# Patient Record
Sex: Male | Born: 1965 | Race: White | Hispanic: No | Smoking: Never smoker
Health system: Southern US, Community
[De-identification: ages and names within clinical notes are randomized; demographics above are authoritative.]

## PROBLEM LIST (undated history)

## (undated) HISTORY — PX: TOOTH EXTRACTION: SUR596

## (undated) HISTORY — PX: WISDOM TOOTH EXTRACTION: SHX21

## (undated) HISTORY — PX: VASCULAR SURGERY: SHX849

---

## 2015-04-28 ENCOUNTER — Other Ambulatory Visit (INDEPENDENT_AMBULATORY_CARE_PROVIDER_SITE_OTHER): Payer: BLUE CROSS/BLUE SHIELD

## 2015-04-28 ENCOUNTER — Ambulatory Visit (INDEPENDENT_AMBULATORY_CARE_PROVIDER_SITE_OTHER): Payer: BLUE CROSS/BLUE SHIELD | Admitting: Family Medicine

## 2015-04-28 ENCOUNTER — Encounter: Payer: Self-pay | Admitting: Family Medicine

## 2015-04-28 ENCOUNTER — Encounter (INDEPENDENT_AMBULATORY_CARE_PROVIDER_SITE_OTHER): Payer: Self-pay

## 2015-04-28 ENCOUNTER — Ambulatory Visit (INDEPENDENT_AMBULATORY_CARE_PROVIDER_SITE_OTHER)
Admission: RE | Admit: 2015-04-28 | Discharge: 2015-04-28 | Disposition: A | Discharge status: Home / Self Care | Payer: BLUE CROSS/BLUE SHIELD | Source: Ambulatory Visit | Attending: Family Medicine | Admitting: Family Medicine

## 2015-04-28 VITALS — BP 116/64 | HR 68 | Temp 97.5°F | Ht 74.0 in | Wt 268.8 lb

## 2015-04-28 DIAGNOSIS — R140 Abdominal distension (gaseous): Secondary | ICD-10-CM

## 2015-04-28 DIAGNOSIS — R222 Localized swelling, mass and lump, trunk: Secondary | ICD-10-CM | POA: Diagnosis not present

## 2015-04-28 DIAGNOSIS — Z0000 Encounter for general adult medical examination without abnormal findings: Secondary | ICD-10-CM | POA: Diagnosis not present

## 2015-04-28 DIAGNOSIS — R7989 Other specified abnormal findings of blood chemistry: Secondary | ICD-10-CM

## 2015-04-28 LAB — CBC WITH DIFFERENTIAL/PLATELET
BASOS ABS: 0 10*3/uL (ref 0.0–0.1)
Basophils Relative: 0.5 % (ref 0.0–3.0)
EOS ABS: 0.2 10*3/uL (ref 0.0–0.7)
Eosinophils Relative: 2.3 % (ref 0.0–5.0)
HEMATOCRIT: 42.7 % (ref 39.0–52.0)
Hemoglobin: 14.4 g/dL (ref 13.0–17.0)
LYMPHS PCT: 28.6 % (ref 12.0–46.0)
Lymphs Abs: 2 10*3/uL (ref 0.7–4.0)
MCHC: 33.8 g/dL (ref 30.0–36.0)
MCV: 95.5 fl (ref 78.0–100.0)
MONOS PCT: 9.5 % (ref 3.0–12.0)
Monocytes Absolute: 0.7 10*3/uL (ref 0.1–1.0)
NEUTROS ABS: 4.1 10*3/uL (ref 1.4–7.7)
NEUTROS PCT: 59.1 % (ref 43.0–77.0)
PLATELETS: 249 10*3/uL (ref 150.0–400.0)
RBC: 4.47 Mil/uL (ref 4.22–5.81)
RDW: 13.4 % (ref 11.5–15.5)
WBC: 6.9 10*3/uL (ref 4.0–10.5)

## 2015-04-28 LAB — COMPREHENSIVE METABOLIC PANEL
ALT: 22 U/L (ref 0–53)
AST: 20 U/L (ref 0–37)
Albumin: 4.2 g/dL (ref 3.5–5.2)
Alkaline Phosphatase: 50 U/L (ref 39–117)
BUN: 9 mg/dL (ref 6–23)
CALCIUM: 9.4 mg/dL (ref 8.4–10.5)
CO2: 26 meq/L (ref 19–32)
Chloride: 102 mEq/L (ref 96–112)
Creatinine, Ser: 1.04 mg/dL (ref 0.40–1.50)
GFR: 81.42 mL/min (ref >60.00)
GLUCOSE: 96 mg/dL (ref 70–99)
Potassium: 3.9 mEq/L (ref 3.5–5.1)
Sodium: 137 mEq/L (ref 135–145)
TOTAL PROTEIN: 7.5 g/dL (ref 6.0–8.3)
Total Bilirubin: 0.7 mg/dL (ref 0.2–1.2)

## 2015-04-28 LAB — LIPID PANEL
CHOL/HDL RATIO: 5
Cholesterol: 205 mg/dL — ABNORMAL HIGH (ref 0–200)
HDL: 40 mg/dL (ref >39.00)
NONHDL: 164.71
TRIGLYCERIDES: 223 mg/dL — AB (ref 0.0–149.0)
VLDL: 44.6 mg/dL — AB (ref 0.0–40.0)

## 2015-04-28 LAB — LDL CHOLESTEROL, DIRECT: Direct LDL: 138 mg/dL

## 2015-04-28 LAB — LIPASE: LIPASE: 33 U/L (ref 11.0–59.0)

## 2015-04-28 LAB — H. PYLORI ANTIBODY, IGG: H PYLORI IGG: NEGATIVE

## 2015-04-28 NOTE — Progress Notes (Signed)
Pre visit review using our clinic review tool, if applicable. No additional management support is needed unless otherwise documented below in the visit note. 

## 2015-04-28 NOTE — Assessment & Plan Note (Signed)
New- ? Prominent sternum but since there is a change and now tender, get CXR for further evaluation. The patient indicates understanding of these issues and agrees with the plan.

## 2015-04-28 NOTE — Assessment & Plan Note (Signed)
New- intermittent. ?biliary colic. He wants to defer RUQ ultrasound at this time. Advised to cut back on ETOH and greasy food- keep symptoms journal. Agrees to labs today. Orders Placed This Encounter  Procedures  . DG Chest 2 View  . CBC with Differential/Platelet  . Comprehensive metabolic panel  . Lipid panel  . PSA  . H. pylori antibody, IgG  . Lipase

## 2015-04-28 NOTE — Progress Notes (Signed)
Subjective:   Patient ID: Christopher Rodriguez, male    DOB: October 12, 1965, 49 y.o.   MRN: [id number]  Christopher Rodriguez is a pleasant 49 y.o. 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  Abdominal bloating/pressure- has had 5 episodes that caused night time awakenings, always occurred after eating greasy food, especially sausage pizza.  Usually lasts an hour or two.  Not alleviated by anything- tried "all types of OTC antacids."  No associated nausea, vomiting, changes in bowel habits, fevers, bloody or black stools. Never had day time symptoms.  ?chest wall mass- has noticed a mass on his sternum for months.  No known injury.  At times painful when he turns a certain way.  No current outpatient prescriptions on file prior to visit.   No current facility-administered medications on file prior to visit.    No Known Allergies  History reviewed. No pertinent past medical history.  Past Surgical History  Procedure Laterality Date  . Vascular surgery      bilateral femoral vein stripping.  . Tooth extraction    . Wisdom tooth extraction      Family History  Problem Relation Age of Onset  . Arthritis Mother   . Hypertension Mother   . Cancer Father   . Asthma Daughter     Social History   Social History  . Marital Status: Unknown    Spouse Name: N/A  . Number of Children: N/A  . Years of Education: N/A   Occupational History  . Not on file.   Social History Main Topics  . Smoking status: Never Smoker   . Smokeless tobacco: Never Used  . Alcohol Use: Yes  . Drug Use: No  . Sexual Activity: Yes   Other Topics Concern  . Not on file   Social History Narrative   Separated.  Kids live in [place], [place] where he is based out of (has home there) but travels all week.  Has been in [place] during the week for a year.       The PMH, PSH, Social History, Family History, Medications, and allergies have been reviewed in [facility], and have been updated if relevant.    Review of Systems  Constitutional: Negative.   HENT: Negative.   Eyes: Negative.   Respiratory: Negative.   Cardiovascular: Negative for chest pain, palpitations and leg swelling.  Gastrointestinal: Positive for abdominal pain and abdominal distention. Negative for nausea, vomiting, diarrhea, constipation, blood in stool, anal bleeding and rectal pain.  Endocrine: Negative.   Genitourinary: Negative.   Musculoskeletal: Negative.   Skin: Negative.   Allergic/Immunologic: Negative.   Neurological: Negative.   Hematological: Negative.   Psychiatric/Behavioral: Negative.   All other systems reviewed and are negative.      Objective:    BP 116/64 mmHg  Pulse 68  Temp(Src) 97.5 F (36.4 C) (Oral)  Ht 6\' 2"  (1.88 m)  Wt 268 lb 12 oz (121.904 kg)  BMI 34.49 kg/m2  SpO2 98%   Physical Exam  Constitutional: He is oriented to person, place, and time. He appears well-developed and well-nourished. No distress.  HENT:  Head: Normocephalic.  Eyes: Conjunctivae are normal.  Neck: Normal range of motion.  Cardiovascular: Normal rate, regular rhythm and normal heart sounds.   Pulmonary/Chest: Effort normal and breath sounds normal. He exhibits no edema, no deformity and  no swelling.    Abdominal: Soft. Bowel sounds are normal. He exhibits no distension and no mass. There is no tenderness. There is no rebound and no guarding.  Musculoskeletal: Normal range of motion. He exhibits no edema.  Neurological: He is alert and oriented to person, place, and time. No cranial nerve deficit.  Skin: Skin is warm and dry.  Psychiatric: He has a normal mood and affect. His behavior is normal. Judgment and thought content normal.  Nursing note and vitals reviewed.         Assessment & Plan:   Abdominal bloating  Chest wall mass No Follow-up on  file.

## 2015-04-28 NOTE — Patient Instructions (Signed)
It was really nice to meet you. We will call you with your chest xray and lab results.

## 2015-12-29 ENCOUNTER — Ambulatory Visit (INDEPENDENT_AMBULATORY_CARE_PROVIDER_SITE_OTHER): Payer: BLUE CROSS/BLUE SHIELD | Admitting: Family Medicine

## 2015-12-29 ENCOUNTER — Ambulatory Visit (HOSPITAL_COMMUNITY): Payer: BLUE CROSS/BLUE SHIELD

## 2015-12-29 ENCOUNTER — Encounter: Payer: Self-pay | Admitting: Family Medicine

## 2015-12-29 ENCOUNTER — Ambulatory Visit (INDEPENDENT_AMBULATORY_CARE_PROVIDER_SITE_OTHER)
Admission: RE | Admit: 2015-12-29 | Discharge: 2015-12-29 | Disposition: A | Discharge status: Home / Self Care | Payer: BLUE CROSS/BLUE SHIELD | Source: Ambulatory Visit | Attending: Family Medicine | Admitting: Family Medicine

## 2015-12-29 VITALS — BP 128/70 | HR 87 | Temp 97.7°F | Wt 273.2 lb

## 2015-12-29 DIAGNOSIS — M109 Gout, unspecified: Secondary | ICD-10-CM

## 2015-12-29 LAB — COMPREHENSIVE METABOLIC PANEL
ALBUMIN: 4.4 g/dL (ref 3.5–5.2)
ALK PHOS: 51 U/L (ref 39–117)
ALT: 33 U/L (ref 0–53)
AST: 20 U/L (ref 0–37)
BILIRUBIN TOTAL: 0.4 mg/dL (ref 0.2–1.2)
BUN: 9 mg/dL (ref 6–23)
CALCIUM: 10.3 mg/dL (ref 8.4–10.5)
CO2: 28 mEq/L (ref 19–32)
CREATININE: 1.04 mg/dL (ref 0.40–1.50)
Chloride: 104 mEq/L (ref 96–112)
GFR: 81.19 mL/min (ref >60.00)
Glucose, Bld: 106 mg/dL — ABNORMAL HIGH (ref 70–99)
Potassium: 4.4 mEq/L (ref 3.5–5.1)
Sodium: 140 mEq/L (ref 135–145)
TOTAL PROTEIN: 8 g/dL (ref 6.0–8.3)

## 2015-12-29 LAB — URIC ACID: Uric Acid, Serum: 8.9 mg/dL — ABNORMAL HIGH (ref 4.0–7.8)

## 2015-12-29 MED ORDER — COLCHICINE 0.6 MG PO TABS: 0.6000 mg | ORAL_TABLET | Freq: Two times a day (BID) | ORAL | Status: DC

## 2015-12-29 MED ORDER — OXYCODONE-ACETAMINOPHEN 5-325 MG PO TABS: 1.0000 | ORAL_TABLET | Freq: Three times a day (TID) | ORAL | Status: DC | PRN

## 2015-12-29 NOTE — Progress Notes (Signed)
   Subjective:   Patient ID: Christopher Rodriguez, male    DOB: February 09, 1966, 50 y.o.   MRN: [id number]  Christopher Rodriguez is a pleasant 50 y.o. year old male who presents to clinic today with Foot Pain and Joint Swelling  on 12/29/2015  HPI:  Left foot pain and swelling- Went to [facility] while traveling last week for acute onset of left foot swelling, redness and pain.  Per pt, no imaging or xray done.  Diagnosed with gout and given 3 tablets of colchicine.  Pain is better but still quite severe.  Swelling has improved.  No current outpatient prescriptions on file prior to visit.   No current facility-administered medications on file prior to visit.    No Known Allergies  No past medical history on file.  Past Surgical History  Procedure Laterality Date  . Vascular surgery      bilateral femoral vein stripping.  . Tooth extraction    . Wisdom tooth extraction      Family History  Problem Relation Age of Onset  . Arthritis Mother   . Hypertension Mother   . Cancer Father   . Asthma Daughter     Social History   Social History  . Marital Status: Unknown    Spouse Name: N/A  . Number of Children: N/A  . Years of Education: N/A   Occupational History  . Not on file.   Social History Main Topics  . Smoking status: Never Smoker   . Smokeless tobacco: Never Used  . Alcohol Use: Yes  . Drug Use: No  . Sexual Activity: Yes   Other Topics Concern  . Not on file   Social History Narrative   Separated.  [place] live in [place], [place] where he is based out of (has home there) but travels all week.  Has been in [place] during the week for a year.      The PMH, PSH, Social History, Family History, Medications, and allergies [facility], and have been updated if relevant.   Review of Systems  Musculoskeletal: Positive for joint swelling and arthralgias.  All other systems reviewed and are negative.      Objective:    BP 128/70 mmHg  Pulse 87  Temp(Src) 97.7 F (36.5  C) (Oral)  Wt 273 lb 4 oz (123.945 kg)  SpO2 97%   Physical Exam  Constitutional: He is oriented to person, place, and time. He appears well-developed and well-nourished. No distress.  HENT:  Head: Normocephalic.  Eyes: Conjunctivae are normal.  Cardiovascular: Normal rate.   Pulmonary/Chest: Effort normal.  Musculoskeletal:       Left ankle: He exhibits swelling. Tenderness.  Neurological: He is alert and oriented to person, place, and time. No cranial nerve deficit.  Skin: Skin is warm and dry. He is not diaphoretic.  Psychiatric: He has a normal mood and affect. His behavior is normal. Judgment and thought content normal.  Nursing note and vitals reviewed.         Assessment & Plan:   Gout, unspecified cause, unspecified chronicity, unspecified site - Plan: Uric Acid, DG Foot Complete Left, Comprehensive metabolic panel No Follow-up on file.

## 2015-12-29 NOTE — Progress Notes (Signed)
Pre visit review using our clinic review tool, if applicable. No additional management support is needed unless otherwise documented below in the visit note. 

## 2015-12-29 NOTE — Assessment & Plan Note (Signed)
New diagnosis which is likely but I do want to check uric acid and get xray today.  He does have significant swelling of his entire foot without injury. Colchicine rx refilled. Given rx for percocet for severe pain. Call or return to clinic prn if these symptoms worsen or fail to improve as anticipated. The patient indicates understanding of these issues and agrees with the plan.

## 2015-12-29 NOTE — Patient Instructions (Signed)
Good to see you. I will call you with your xray and lab results from today.  Take colchicine as directed.  Percocet as needed for severe pain.

## 2015-12-30 ENCOUNTER — Telehealth: Payer: Self-pay

## 2015-12-30 NOTE — Telephone Encounter (Signed)
Fax received, completed at cover my meds. For pt's colchicine Awaiting response.

## 2016-01-02 MED ORDER — COLCHICINE 0.6 MG PO TABS: 0.6000 mg | ORAL_TABLET | Freq: Two times a day (BID) | ORAL | Status: DC

## 2016-01-02 NOTE — Addendum Note (Signed)
Addended by: Desmond DikeKNIGHT, Ciji Boston H on: 01/02/2016 09:47 AM   Modules accepted: Orders

## 2016-01-02 NOTE — Telephone Encounter (Signed)
Fax received indicating brand name covered. New Rx sent to pharmacy

## 2016-03-26 ENCOUNTER — Telehealth: Payer: Self-pay

## 2016-03-26 NOTE — Telephone Encounter (Signed)
Eliquis approved per [employer] through 09/02/2016. PA- [id number].

## 2016-09-15 IMAGING — CR DG CHEST 2V
4 series · 4 of 4 positions shown · non-contrast
Comparison: None.

CLINICAL DATA: Chest wall/sternal mass.

EXAM:
CHEST  2 VIEW

[view not recorded (1 of 4)]
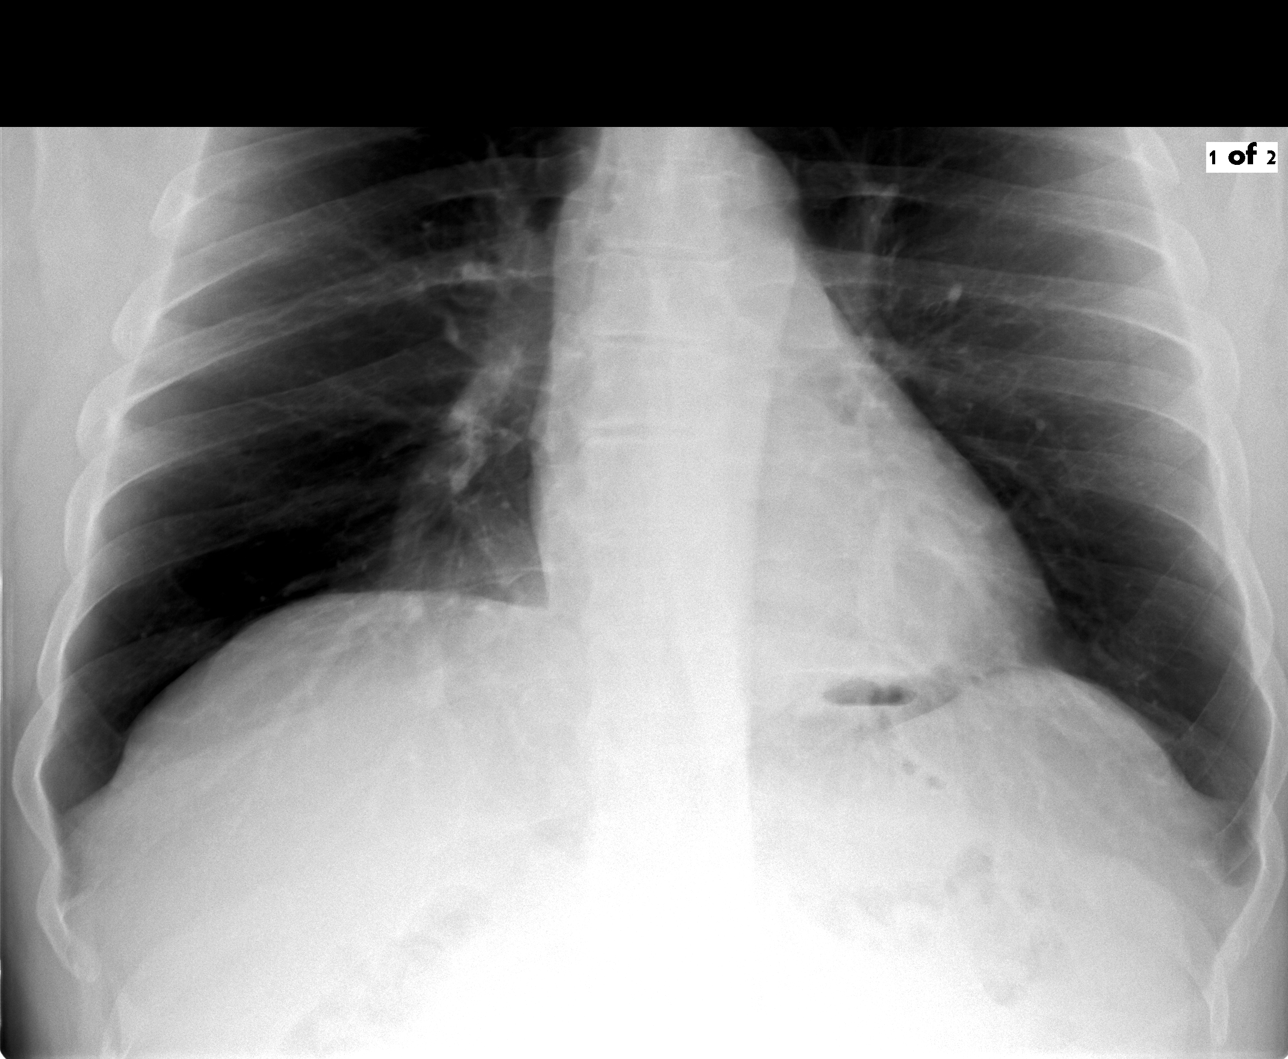

[view not recorded (2 of 4)]
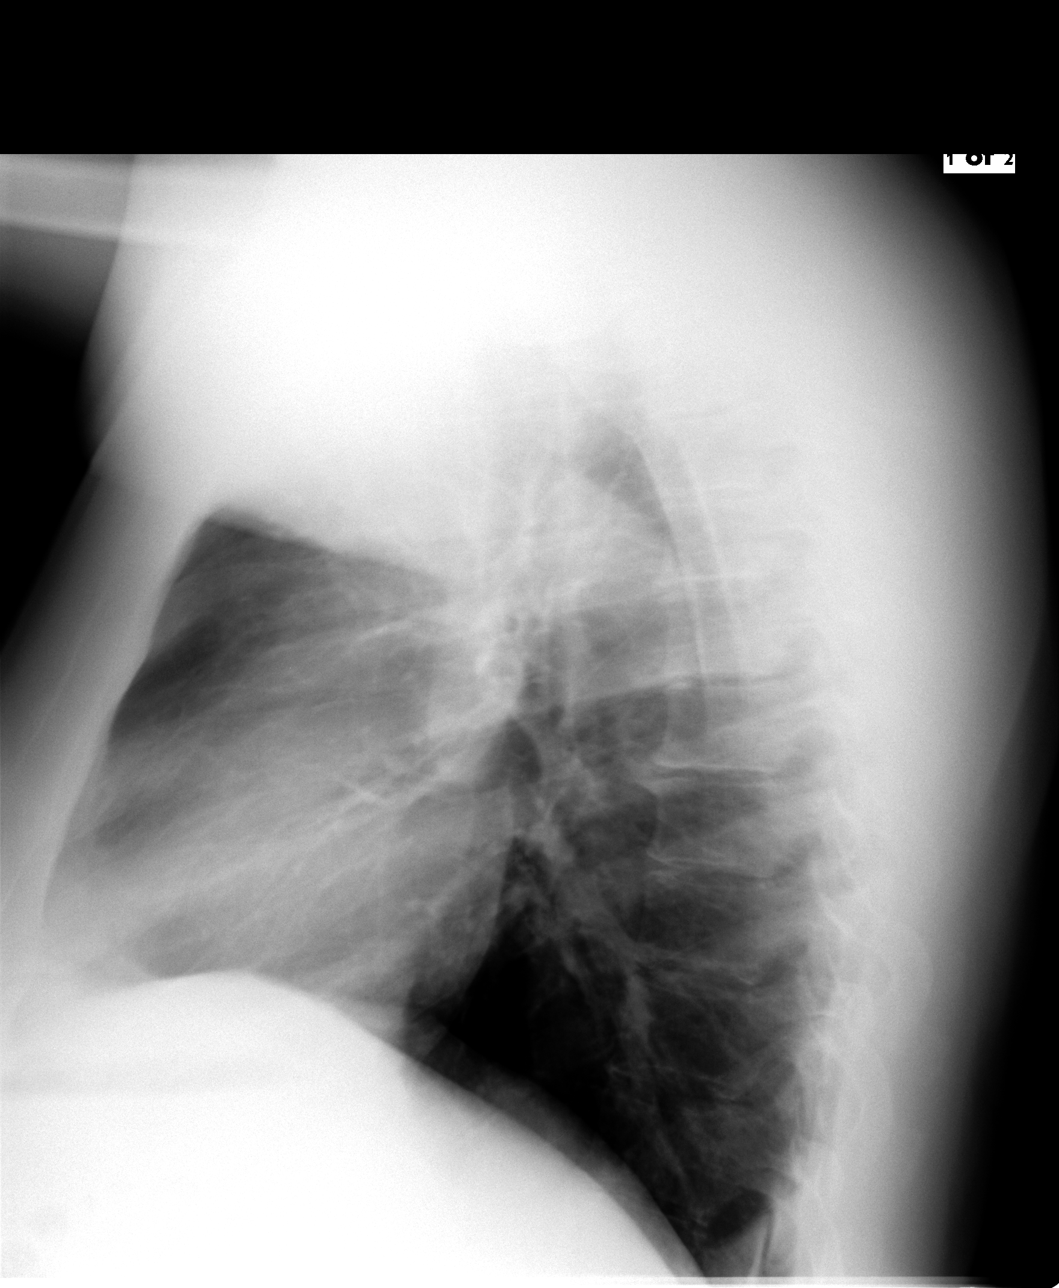

[view not recorded (3 of 4)]
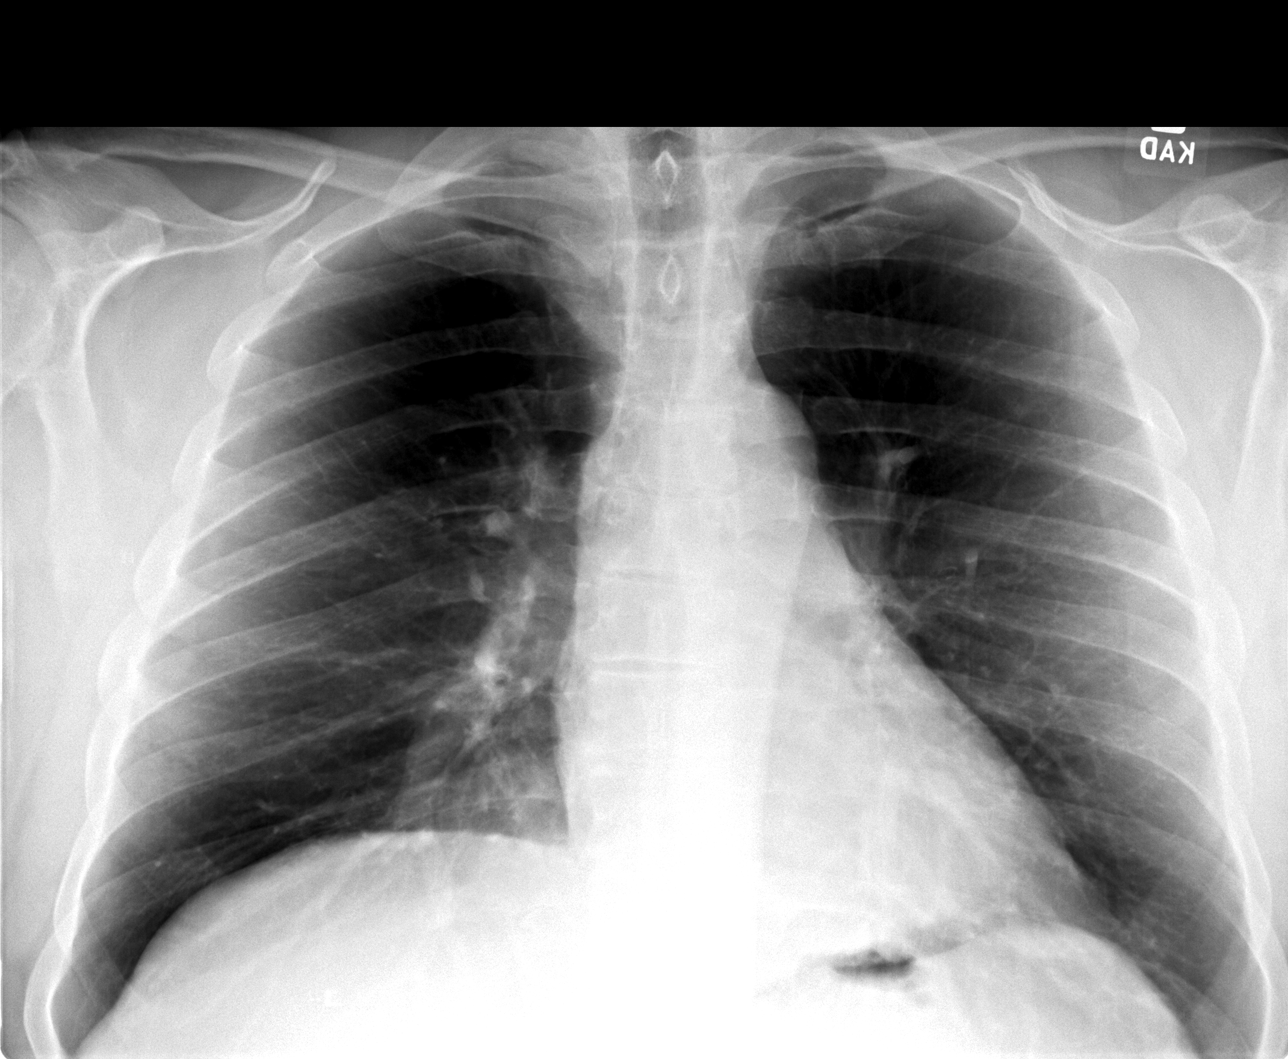

[view not recorded (4 of 4)]
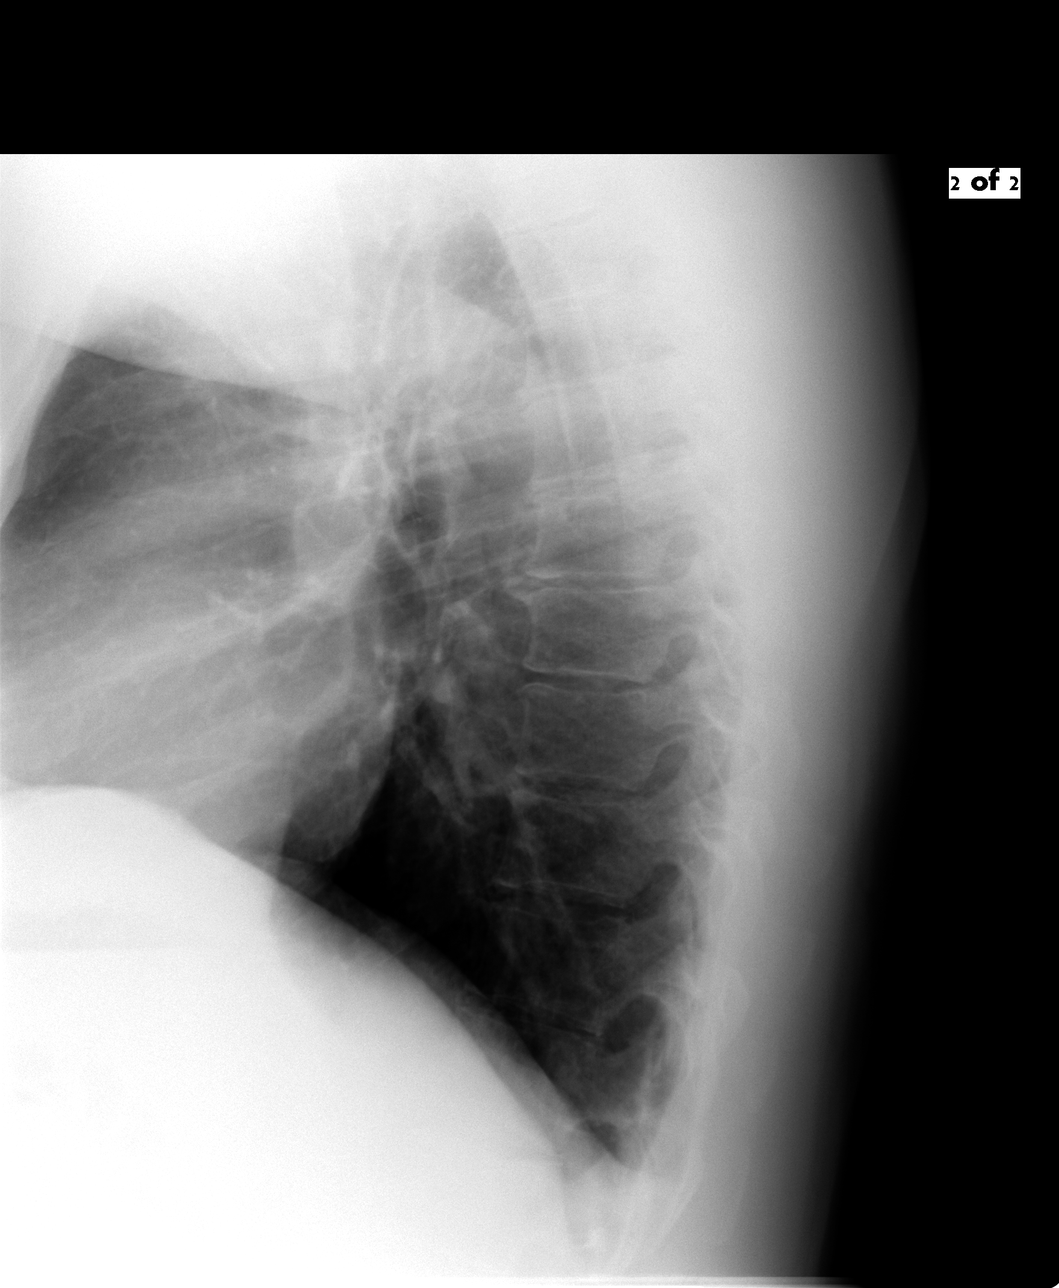

[4 of 4 positions shown; findings below may reference images not displayed]

FINDINGS: Mediastinum and hilar structures are normal. Heart size normal.
Density noted over the cardiophrenic angle. Although this may
represent a prominent fat pad, given the patient's history of the
chest wall mass, contrast-enhanced chest CT should be considered for
further evaluation. No acute bony abnormality identified. Sternum is
incompletely visualized but where visualized appears normal.
Degenerative changes thoracic spine .
IMPRESSION: 1. Density noted over the right cardiophrenic angle, this may
represent overlapping shadows or process such as benign fat pad.
Given patient's history of chest wall mass contrast-enhanced chest
CT should be considered for further evaluation.
2. Sternum is incompletely visualized. Visualized portion appears
normal .

## 2016-11-19 ENCOUNTER — Telehealth: Payer: Self-pay | Admitting: Family Medicine

## 2016-11-19 DIAGNOSIS — Z0000 Encounter for general adult medical examination without abnormal findings: Secondary | ICD-10-CM

## 2016-11-19 NOTE — Telephone Encounter (Signed)
Orders entered

## 2016-11-19 NOTE — Telephone Encounter (Signed)
Pt coming in tomorrow to do cpe labs, can you please add orders? Thank you

## 2016-11-20 ENCOUNTER — Other Ambulatory Visit: Payer: BLUE CROSS/BLUE SHIELD

## 2016-11-21 ENCOUNTER — Other Ambulatory Visit (INDEPENDENT_AMBULATORY_CARE_PROVIDER_SITE_OTHER): Payer: BLUE CROSS/BLUE SHIELD

## 2016-11-21 DIAGNOSIS — Z0000 Encounter for general adult medical examination without abnormal findings: Secondary | ICD-10-CM | POA: Diagnosis not present

## 2016-11-21 DIAGNOSIS — R7989 Other specified abnormal findings of blood chemistry: Secondary | ICD-10-CM

## 2016-11-21 LAB — CBC WITH DIFFERENTIAL/PLATELET
BASOS ABS: 0 10*3/uL (ref 0.0–0.1)
Basophils Relative: 0.6 % (ref 0.0–3.0)
EOS PCT: 1.8 % (ref 0.0–5.0)
Eosinophils Absolute: 0.1 10*3/uL (ref 0.0–0.7)
HCT: 40.9 % (ref 39.0–52.0)
HEMOGLOBIN: 14.1 g/dL (ref 13.0–17.0)
LYMPHS PCT: 26.6 % (ref 12.0–46.0)
Lymphs Abs: 2 10*3/uL (ref 0.7–4.0)
MCHC: 34.5 g/dL (ref 30.0–36.0)
MCV: 93.4 fl (ref 78.0–100.0)
Monocytes Absolute: 0.6 10*3/uL (ref 0.1–1.0)
Monocytes Relative: 8.1 % (ref 3.0–12.0)
Neutro Abs: 4.6 10*3/uL (ref 1.4–7.7)
Neutrophils Relative %: 62.9 % (ref 43.0–77.0)
Platelets: 235 10*3/uL (ref 150.0–400.0)
RBC: 4.37 Mil/uL (ref 4.22–5.81)
RDW: 13.4 % (ref 11.5–15.5)
WBC: 7.4 10*3/uL (ref 4.0–10.5)

## 2016-11-21 LAB — COMPREHENSIVE METABOLIC PANEL
ALBUMIN: 4.1 g/dL (ref 3.5–5.2)
ALT: 20 U/L (ref 0–53)
AST: 15 U/L (ref 0–37)
Alkaline Phosphatase: 58 U/L (ref 39–117)
BILIRUBIN TOTAL: 0.9 mg/dL (ref 0.2–1.2)
BUN: 8 mg/dL (ref 6–23)
CALCIUM: 9.6 mg/dL (ref 8.4–10.5)
CO2: 31 mEq/L (ref 19–32)
CREATININE: 0.95 mg/dL (ref 0.40–1.50)
Chloride: 102 mEq/L (ref 96–112)
GFR: 89.78 mL/min (ref >60.00)
Glucose, Bld: 103 mg/dL — ABNORMAL HIGH (ref 70–99)
Potassium: 3.8 mEq/L (ref 3.5–5.1)
Sodium: 139 mEq/L (ref 135–145)
TOTAL PROTEIN: 6.9 g/dL (ref 6.0–8.3)

## 2016-11-21 LAB — LIPID PANEL
CHOLESTEROL: 222 mg/dL — AB (ref 0–200)
HDL: 46.3 mg/dL (ref >39.00)
Total CHOL/HDL Ratio: 5

## 2016-11-21 LAB — LDL CHOLESTEROL, DIRECT: Direct LDL: 126 mg/dL

## 2016-11-21 LAB — PSA: PSA: 1.91 ng/mL (ref 0.10–4.00)

## 2016-11-27 ENCOUNTER — Encounter: Payer: BLUE CROSS/BLUE SHIELD | Admitting: Family Medicine

## 2016-11-28 ENCOUNTER — Encounter: Payer: Self-pay | Admitting: Family Medicine

## 2016-11-28 ENCOUNTER — Ambulatory Visit (INDEPENDENT_AMBULATORY_CARE_PROVIDER_SITE_OTHER): Payer: BLUE CROSS/BLUE SHIELD | Admitting: Family Medicine

## 2016-11-28 VITALS — BP 144/86 | HR 85 | Temp 97.4°F | Ht 74.0 in | Wt 272.0 lb

## 2016-11-28 DIAGNOSIS — M109 Gout, unspecified: Secondary | ICD-10-CM

## 2016-11-28 DIAGNOSIS — Z0000 Encounter for general adult medical examination without abnormal findings: Secondary | ICD-10-CM | POA: Diagnosis not present

## 2016-11-28 DIAGNOSIS — N529 Male erectile dysfunction, unspecified: Secondary | ICD-10-CM

## 2016-11-28 DIAGNOSIS — F419 Anxiety disorder, unspecified: Secondary | ICD-10-CM

## 2016-11-28 DIAGNOSIS — E782 Mixed hyperlipidemia: Secondary | ICD-10-CM | POA: Diagnosis not present

## 2016-11-28 DIAGNOSIS — F418 Other specified anxiety disorders: Secondary | ICD-10-CM

## 2016-11-28 DIAGNOSIS — F329 Major depressive disorder, single episode, unspecified: Secondary | ICD-10-CM

## 2016-11-28 MED ORDER — SERTRALINE HCL 25 MG PO TABS: 25.0000 mg | ORAL_TABLET | Freq: Every day | ORAL | 3 refills | Status: DC

## 2016-11-28 MED ORDER — SILDENAFIL CITRATE 100 MG PO TABS: 50.0000 mg | ORAL_TABLET | Freq: Every day | ORAL | 11 refills | Status: AC | PRN

## 2016-11-28 NOTE — Assessment & Plan Note (Signed)
New- likely multifactorial- anxiety, excessive alcohol use, weight gain. Given rx for viagra to try as needed but did discuss risks and side effects. Call or return to clinic prn if these symptoms worsen or fail to improve as anticipated. The patient indicates understanding of these issues and agrees with the plan.

## 2016-11-28 NOTE — Progress Notes (Signed)
Subjective:   Patient ID: Christopher Rodriguez, male    DOB: 1966/02/16, 51 y.o.   MRN: [id number]  Christopher RenGlenn Kendrix is a pleasant 51 y.o. year old male who presents to clinic today with Annual Exam  on 11/28/2016  HPI:  Hypertriglyceridemia- very elevated this month. Admits to drinking several beers every night. He feels he is using alcohol to deal with his stressors. Things with his ex wife and kids are better. He is in a healthy relationship with his girlfriend but still frustrated that he cannot get a job in [place].  Girlfriend's dad recently died and he was close to him.  Feels anxious and sad.  Denies feeling "depressed."  Sleeping well.  Admits to not exercising. No SI or HI.  Feels his sex drive has decreased and cannot maintain an erection at times.  No difficulty maintaining an erection while masturbating.  Asking for a trial of viagra.   Lab Results  Component Value Date   CHOL 222 (H) 11/21/2016   HDL 46.30 11/21/2016   LDLDIRECT 126.0 11/21/2016   TRIG (H) 11/21/2016    502.0 Triglyceride is over 400; calculations on Lipids are invalid.   CHOLHDL 5 11/21/2016   Lab Results  Component Value Date   PSA 1.91 11/21/2016   Lab Results  Component Value Date   ALT 20 11/21/2016   AST 15 11/21/2016   ALKPHOS 58 11/21/2016   BILITOT 0.9 11/21/2016   Lab Results  Component Value Date   NA 139 11/21/2016   K 3.8 11/21/2016   CL 102 11/21/2016   CO2 31 11/21/2016   Lab Results  Component Value Date   CREATININE 0.95 11/21/2016     No current outpatient prescriptions on file prior to visit.   No current facility-administered medications on file prior to visit.     No Known Allergies  History reviewed. No pertinent past medical history.  Past Surgical History:  Procedure Laterality Date  . TOOTH EXTRACTION    . VASCULAR SURGERY     bilateral femoral vein stripping.  . WISDOM TOOTH EXTRACTION      Family History  Problem Relation Age of Onset  .  Arthritis Mother   . Hypertension Mother   . Cancer Father   . Asthma Daughter     Social History   Social History  . Marital status: Unknown    Spouse name: N/A  . Number of children: N/A  . Years of education: N/A   Occupational History  . Not on file.   Social History Main Topics  . Smoking status: Never Smoker  . Smokeless tobacco: Never Used  . Alcohol use Yes  . Drug use: No  . Sexual activity: Yes   Other Topics Concern  . Not on file   Social History Narrative   Separated.  [place] live in [place], [place] where [place] is based out of (has home there) but travels all week.  Has been in [place] during the week for a year.      The PMH, PSH, Social History, Family History, Medications, and allergies have [facility], and have been updated if relevant.   Review of Systems  Constitutional: Negative.   HENT: Negative.   Eyes: Negative.   Respiratory: Negative.   Cardiovascular: Negative.   Gastrointestinal: Negative.   Endocrine: Negative.   Genitourinary: Negative.        +ED  Musculoskeletal: Negative.   Skin: Negative.   Allergic/Immunologic: Negative.   Neurological: Negative.  Psychiatric/Behavioral: Positive for dysphoric mood. Negative for agitation, behavioral problems, confusion, decreased concentration, hallucinations, self-injury, sleep disturbance and suicidal ideas. The patient is nervous/anxious. The patient is not hyperactive.   All other systems reviewed and are negative.      Objective:    BP (!) 144/86   Pulse 85   Temp 97.4 F (36.3 C)   Ht 6\' 2"  (1.88 m)   Wt 272 lb (123.4 kg)   SpO2 97%   BMI 34.92 kg/m   Wt Readings from Last 3 Encounters:  11/28/16 272 lb (123.4 kg)  12/29/15 273 lb 4 oz (123.9 kg)  04/28/15 268 lb 12 oz (121.9 kg)    Physical Exam  General:  pleasant male in no acute distress Eyes:  PERRL Ears:  External ear exam shows no significant lesions or deformities.  TMs normal bilaterally Hearing is  grossly normal bilaterally. Nose:  External nasal examination shows no deformity or inflammation. Nasal mucosa are pink and moist without lesions or exudates. Mouth:  Oral mucosa and oropharynx without lesions or exudates.  Teeth in good repair. Neck:  no carotid bruit or thyromegaly no cervical or supraclavicular lymphadenopathy  Lungs:  Normal respiratory effort, chest expands symmetrically. Lungs are clear to auscultation, no crackles or wheezes. Heart:  Normal rate and regular rhythm. S1 and S2 normal without gallop, murmur, click, rub or other extra sounds. Abdomen:  Bowel sounds positive,abdomen soft and non-tender without masses, organomegaly or hernias noted. Pulses:  R and L posterior tibial pulses are full and equal bilaterally  Extremities:  no edema  Psych:  Good eye contact, not anxious or depressed appearing       Assessment & Plan:   Gout, unspecified cause, unspecified chronicity, unspecified site  Visit for well man health check No Follow-up on file.

## 2016-11-28 NOTE — Assessment & Plan Note (Addendum)
Start Zoloft 25 mg. We discussed possible side effects of headache, GI upset, drowsiness, and SI/HI. If thoughts of SI/HI develop, we discussed to present to the emergency immediately. Patient verbalized understanding.   He will update me in a few weeks.

## 2016-11-28 NOTE — Patient Instructions (Signed)
Great to see you.  We are starting zoloft 25 mg daily.  Please call me in a few weeks with an update.  I will send your results to you on my chart.

## 2016-11-28 NOTE — Addendum Note (Signed)
Addended by: Baldomero LamyHAVERS, NATASHA C on: 11/28/2016 12:08 PM   Modules accepted: Orders

## 2016-11-28 NOTE — Assessment & Plan Note (Signed)
Reviewed preventive care protocols, scheduled due services, and updated immunizations Discussed nutrition, exercise, diet, and healthy lifestyle.  

## 2016-11-28 NOTE — Assessment & Plan Note (Signed)
Repeat triglycerides today. Likely due to increased ETOH use and weight gain/sedentary life style. If repeat labs as elevated, will start tricor. The patient indicates understanding of these issues and agrees with the plan.

## 2016-11-29 ENCOUNTER — Other Ambulatory Visit (INDEPENDENT_AMBULATORY_CARE_PROVIDER_SITE_OTHER): Payer: BLUE CROSS/BLUE SHIELD

## 2016-11-29 DIAGNOSIS — E782 Mixed hyperlipidemia: Secondary | ICD-10-CM | POA: Diagnosis not present

## 2016-11-29 LAB — LIPID PANEL
CHOL/HDL RATIO: 5
Cholesterol: 224 mg/dL — ABNORMAL HIGH (ref 0–200)
HDL: 44.1 mg/dL (ref >39.00)
NONHDL: 180.33
TRIGLYCERIDES: 343 mg/dL — AB (ref 0.0–149.0)
VLDL: 68.6 mg/dL — ABNORMAL HIGH (ref 0.0–40.0)

## 2016-11-29 LAB — LDL CHOLESTEROL, DIRECT: LDL DIRECT: 117 mg/dL

## 2016-12-04 ENCOUNTER — Encounter: Payer: Self-pay | Admitting: Family Medicine

## 2016-12-05 ENCOUNTER — Other Ambulatory Visit: Payer: Self-pay | Admitting: Family Medicine

## 2016-12-05 MED ORDER — SERTRALINE HCL 50 MG PO TABS: 50.0000 mg | ORAL_TABLET | Freq: Every day | ORAL | 3 refills | Status: DC

## 2016-12-26 ENCOUNTER — Encounter: Payer: Self-pay | Admitting: Family Medicine

## 2017-01-10 ENCOUNTER — Telehealth: Payer: Self-pay

## 2017-01-10 NOTE — Telephone Encounter (Signed)
[facility] in [place] left v/m; pt came by window [facility] [place] with empty bottle of zoloft dated 01/04/17. Pt picked up # 30 which is a thirty day supply of zoloft at [facility] [place] on 01/04/17. [facility] [place] request cb verifying it is OK with physician that pt pick up another # 30 of zoloft paying cash. Pt took 30 day supply in 6 days. Please advise.

## 2017-01-10 NOTE — Telephone Encounter (Signed)
I know he is living between both locations.  I am ok with him getting a refill.  Please call pt to get more information.

## 2017-01-11 NOTE — Telephone Encounter (Signed)
Spoke to pt. [place] is leaving for [place] on 01[place]13[place] for more than a month for work. He will run out of the rx he got 01-04-17 before he returns. [facility] [facility] [place]. I explained to her that he needs another refill due to travel. She will tell the pharmacist.

## 2017-01-11 NOTE — Telephone Encounter (Signed)
[facility] [place] left v/m and request cb with sertraline request and concerns.

## 2017-01-11 NOTE — Telephone Encounter (Signed)
Tried to call pt to find out what is going on with the sertraline. He just got #30. Had to leave a message to call the office. I am not calling [facility] until I talk to the pt.

## 2017-02-26 ENCOUNTER — Encounter: Payer: Self-pay | Admitting: Family Medicine

## 2017-02-27 MED ORDER — SERTRALINE HCL 50 MG PO TABS: 50.0000 mg | ORAL_TABLET | Freq: Every day | ORAL | 3 refills | Status: DC

## 2018-01-05 ENCOUNTER — Other Ambulatory Visit: Payer: Self-pay | Admitting: Family Medicine

## 2019-01-06 ENCOUNTER — Telehealth: Payer: Self-pay | Admitting: Family Medicine

## 2019-01-06 NOTE — Telephone Encounter (Signed)
Called because it has been since 2018 since pt saw Dr Dayton Martes which was at [facility], Called to see if pt still has Dr Dayton Martes as PCP or if they see someone else now and if they still do have her if we could get him scheduled for an appt, whether it be follow up or CPE
